# Patient Record
Sex: Male | Born: 2000 | Race: Black or African American | Hispanic: No | Marital: Single | Smoking: Never smoker
Health system: Southern US, Community
[De-identification: ages and names within clinical notes are randomized; demographics above are authoritative.]

---
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
## 2022-08-12 ENCOUNTER — Ambulatory Visit (HOSPITAL_COMMUNITY)
Admission: EM | Admit: 2022-08-12 | Discharge: 2022-08-12 | Disposition: A | Discharge status: Home / Self Care | Payer: Medicaid Other

## 2022-08-12 ENCOUNTER — Encounter (HOSPITAL_COMMUNITY): Payer: Self-pay | Admitting: *Deleted

## 2022-08-12 DIAGNOSIS — B349 Viral infection, unspecified: Secondary | ICD-10-CM

## 2022-08-12 NOTE — Discharge Instructions (Signed)
Work note attached You are welcome to return as needed

## 2022-08-12 NOTE — ED Triage Notes (Signed)
Pt states he has no sx and he was out of work over the weekend and needs a note to go back to work.

## 2022-08-12 NOTE — ED Provider Notes (Signed)
[facility]    CSN: [id number] Arrival date & time: 08/12/22  1114      History   Chief Complaint Chief Complaint  Patient presents with   Letter for School/Work    HPI Sean Faulkner is a 22 y.o. male.  Presents for return to work note He was sick last week with viral symptoms Reports sx have fully resolved as of yesterday.  He tried to go to work, they required a note saying he can return  No recent fevers  History reviewed. No pertinent past medical history.  There are no problems to display for this patient.   History reviewed. No pertinent surgical history.   Home Medications    Prior to Admission medications   Not on File    Family History History reviewed. No pertinent family history.  Social History Social History   Tobacco Use   Smoking status: Never   Smokeless tobacco: Never  Vaping Use   Vaping Use: Every day  Substance Use Topics   Alcohol use: Never   Drug use: Yes    Types: Marijuana     Allergies   Patient has no known allergies.   Review of Systems Review of Systems Negative as per HPI  Physical Exam Triage Vital Signs ED Triage Vitals  Enc Vitals Group     BP 08/12/22 1144 127/70     Pulse Rate 08/12/22 1144 (!) 57     Resp 08/12/22 1144 18     Temp 08/12/22 1144 98.2 F (36.8 C)     Temp Source 08/12/22 1144 Oral     SpO2 08/12/22 1144 98 %     Weight --      Height --      Head Circumference --      Peak Flow --      Pain Score 08/12/22 1143 0     Pain Loc --      Pain Edu? --      Excl. in [place]? --    No data found.  Updated Vital Signs BP 127/70 (BP Location: Right Arm)   Pulse (!) 57   Temp 98.2 F (36.8 C) (Oral)   Resp 18   SpO2 98%    Physical Exam Vitals and nursing note reviewed.  Constitutional:      General: He is not in acute distress.    Appearance: Normal appearance.  HENT:     Mouth/Throat:     Pharynx: Oropharynx is clear.  Cardiovascular:     Rate and Rhythm: Normal rate  and regular rhythm.     Pulses: Normal pulses.  Pulmonary:     Effort: Pulmonary effort is normal.  Neurological:     Mental Status: He is alert and oriented to person, place, and time.     UC Treatments / Results  Labs (all labs ordered are listed, but only abnormal results are displayed) Labs Reviewed - No data to display  EKG   Radiology No results found.  Procedures Procedures (including critical care time)  Medications Ordered in UC Medications - No data to display  Initial Impression / Assessment and Plan / UC Course  I have reviewed the triage vital signs and the nursing notes.  Pertinent labs & imaging results that were available during my care of the patient were reviewed by me and considered in my medical decision making (see chart for details).  No symptoms Work note provided Discussed return if needed  Final Clinical Impressions(s) / UC  Diagnoses   Final diagnoses:  Viral illness     Discharge Instructions      Work note attached You are welcome to return as needed    ED Prescriptions   None    PDMP not reviewed this encounter.   Abdul Beirne, Wells Guiles, [place] 08/12/22 1204
